# Patient Record
Sex: Male | Born: 1990 | Race: White | Hispanic: No | Marital: Single | Smoking: Current every day smoker
Health system: Southern US, Community
[De-identification: ages and names within clinical notes are randomized; demographics above are authoritative.]

## PROBLEM LIST (undated history)

## (undated) HISTORY — PX: MANDIBLE SURGERY: SHX707

---
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
## 2009-09-24 ENCOUNTER — Inpatient Hospital Stay (HOSPITAL_COMMUNITY): Admission: AC | Admit: 2009-09-24 | Discharge: 2009-09-25 | Payer: Self-pay

## 2010-06-18 LAB — LACTIC ACID, PLASMA: Lactic Acid, Venous: 2.7 mmol/L — ABNORMAL HIGH (ref 0.5–2.2)

## 2010-06-18 LAB — URINE MICROSCOPIC-ADD ON

## 2010-06-18 LAB — CBC
HCT: 39.7 % (ref 39.0–52.0)
Hemoglobin: 13.5 g/dL (ref 13.0–17.0)
MCHC: 34 g/dL (ref 30.0–36.0)
MCHC: 34.5 g/dL (ref 30.0–36.0)
MCV: 93.8 fL (ref 78.0–100.0)
Platelets: 186 10*3/uL (ref 150–400)
Platelets: 212 10*3/uL (ref 150–400)
RBC: 4.22 MIL/uL (ref 4.22–5.81)
RDW: 13.1 % (ref 11.5–15.5)
WBC: 10.4 10*3/uL (ref 4.0–10.5)
WBC: 10.8 10*3/uL — ABNORMAL HIGH (ref 4.0–10.5)

## 2010-06-18 LAB — MRSA PCR SCREENING: MRSA by PCR: NEGATIVE

## 2010-06-18 LAB — PROTIME-INR
INR: 1.23 (ref 0.00–1.49)
Prothrombin Time: 15.4 seconds — ABNORMAL HIGH (ref 11.6–15.2)

## 2010-06-18 LAB — URINALYSIS, ROUTINE W REFLEX MICROSCOPIC
Bilirubin Urine: NEGATIVE
Ketones, ur: NEGATIVE mg/dL
Nitrite: NEGATIVE
Protein, ur: NEGATIVE mg/dL
Specific Gravity, Urine: 1.006 (ref 1.005–1.030)
Urobilinogen, UA: 0.2 mg/dL (ref 0.0–1.0)
pH: 6.5 (ref 5.0–8.0)

## 2010-06-18 LAB — COMPREHENSIVE METABOLIC PANEL
ALT: 16 U/L (ref 0–53)
AST: 21 U/L (ref 0–37)
CO2: 22 mEq/L (ref 19–32)
Calcium: 7.6 mg/dL — ABNORMAL LOW (ref 8.4–10.5)
Potassium: 3.7 mEq/L (ref 3.5–5.1)
Total Bilirubin: 0.5 mg/dL (ref 0.3–1.2)
Total Protein: 6.2 g/dL (ref 6.0–8.3)

## 2010-06-18 LAB — BASIC METABOLIC PANEL
BUN: 6 mg/dL (ref 6–23)
CO2: 27 mEq/L (ref 19–32)
Calcium: 8.9 mg/dL (ref 8.4–10.5)
GFR calc Af Amer: >60 mL/min (ref >60)
GFR calc non Af Amer: >60 mL/min (ref >60)

## 2010-06-18 LAB — APTT: aPTT: 26 seconds (ref 24–37)

## 2010-06-18 LAB — RAPID URINE DRUG SCREEN, HOSP PERFORMED
Amphetamines: NOT DETECTED
Barbiturates: NOT DETECTED
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
## 2011-01-10 LAB — URINALYSIS, ROUTINE W REFLEX MICROSCOPIC
Bilirubin Urine: NEGATIVE
Glucose, UA: NEGATIVE
Hgb urine dipstick: NEGATIVE
Ketones, ur: NEGATIVE
Nitrite: NEGATIVE
Protein, ur: NEGATIVE
Specific Gravity, Urine: 1.014
Urobilinogen, UA: 0.2
pH: 7
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
## 2013-02-24 ENCOUNTER — Emergency Department (HOSPITAL_COMMUNITY)
Admission: EM | Admit: 2013-02-24 | Discharge: 2013-02-24 | Disposition: A | Discharge status: Home / Self Care | Payer: Self-pay | Attending: Emergency Medicine | Admitting: Emergency Medicine

## 2013-02-24 ENCOUNTER — Emergency Department (HOSPITAL_COMMUNITY): Payer: Self-pay

## 2013-02-24 ENCOUNTER — Encounter (HOSPITAL_COMMUNITY): Payer: Self-pay | Admitting: Emergency Medicine

## 2013-02-24 DIAGNOSIS — F17200 Nicotine dependence, unspecified, uncomplicated: Secondary | ICD-10-CM | POA: Insufficient documentation

## 2013-02-24 DIAGNOSIS — S02650A Fracture of angle of mandible, unspecified side, initial encounter for closed fracture: Secondary | ICD-10-CM | POA: Insufficient documentation

## 2013-02-24 DIAGNOSIS — S02609A Fracture of mandible, unspecified, initial encounter for closed fracture: Secondary | ICD-10-CM
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
## 2013-02-24 NOTE — ED Notes (Signed)
States Marijuana use today and took pill for pain. States he also took pill and does not know what it is. States pill was blue.
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
## 2013-02-25 NOTE — ED Provider Notes (Signed)
Medical screening examination/treatment/procedure(s) were performed by non-physician practitioner and as supervising physician I was immediately available for consultation/collaboration.     Geoffery Lyons, MD 02/25/13 0001

## 2017-05-01 ENCOUNTER — Encounter (HOSPITAL_COMMUNITY): Payer: Self-pay

## 2017-05-01 ENCOUNTER — Emergency Department (HOSPITAL_COMMUNITY)
Admission: EM | Admit: 2017-05-01 | Discharge: 2017-05-02 | Disposition: A | Discharge status: Home / Self Care | Payer: Medicaid Other | Attending: Emergency Medicine | Admitting: Emergency Medicine

## 2017-05-01 DIAGNOSIS — K047 Periapical abscess without sinus: Secondary | ICD-10-CM | POA: Insufficient documentation

## 2017-05-01 DIAGNOSIS — Z79899 Other long term (current) drug therapy: Secondary | ICD-10-CM | POA: Diagnosis not present

## 2017-05-01 DIAGNOSIS — K0889 Other specified disorders of teeth and supporting structures: Secondary | ICD-10-CM

## 2017-05-01 DIAGNOSIS — F17210 Nicotine dependence, cigarettes, uncomplicated: Secondary | ICD-10-CM | POA: Insufficient documentation

## 2017-05-01 MED ORDER — PENICILLIN V POTASSIUM 500 MG PO TABS: 500.0000 mg | ORAL_TABLET | Freq: Four times a day (QID) | ORAL | 0 refills | Status: AC

## 2017-05-01 MED ORDER — LIDOCAINE VISCOUS 2 % MT SOLN: 20.0000 mL | OROMUCOSAL | 0 refills | Status: DC | PRN

## 2017-05-01 MED ORDER — PENICILLIN V POTASSIUM 250 MG PO TABS
500.0000 mg | ORAL_TABLET | Freq: Once | ORAL | Status: AC
  Administered 2017-05-02: 500 mg via ORAL
  Filled 2017-05-01: qty 2

## 2017-05-01 NOTE — Discharge Instructions (Signed)
Take antibiotics as prescribed.  Take the entire course of antibiotics, even if your symptoms improve. Use viscous lidocaine as needed for pain. Continue to use Tylenol and ibuprofen as needed for pain and swelling. Follow-up with a dentist.  There is information about dentists in the area and the insurance is the except in the paperwork. Return to the emergency room if you develop high fevers, difficulty breathing, difficulty opening her mouth, or any new or worsening symptoms.

## 2017-05-01 NOTE — ED Triage Notes (Signed)
Pt states that for the past five days he has had dental pain due to broken tooth on upper L side

## 2017-05-01 NOTE — ED Notes (Signed)
ED Provider at bedside. 

## 2017-05-02 NOTE — ED Provider Notes (Signed)
[facility] Provider Note   CSN: [id number] Arrival date & time: 05/01/17  2036     History   Chief Complaint Chief Complaint  Patient presents with  . Dental Pain    HPI Frederick ShanBrandon K Hunt is a 27 y.o. male presenting for evaluation of tooth pain.  Pt states for the past 5 days, he has had pain of left upper and lower teeth.  He reports he has had intermittent pain of both of these areas over the past several months, but the past 5 days have been worse.  He has been taking Tylenol and ibuprofen without improvement of pain.  Ice initially hurts, but then numbs the area and gives mild relief.  He denies fevers, chills, nausea, or vomiting.  He states he has had to have multiple teeth pulled in the past, and has a history of poor dentition.  He has no other medical problems, does not take medications daily.  He is not immunocompromise.  He has no allergies to antibiotics.  HPI  History reviewed. No pertinent past medical history.  There are no active problems to display for this patient.   Past Surgical History:  Procedure Laterality Date  . MANDIBLE SURGERY         Home Medications    Prior to Admission medications   Medication Sig Start Date End Date Taking? Authorizing Provider  lidocaine (XYLOCAINE) 2 % solution Use as directed 20 mLs in the mouth or throat as needed for mouth pain. 05/01/17   Amarri Michaelson, PA-C  OVER THE COUNTER MEDICATION Take 1 tablet by mouth daily as needed (pain). Over the counter [employer] brand pain reliever    [provider]  penicillin v potassium (VEETID) 500 MG tablet Take 1 tablet (500 mg total) by mouth 4 (four) times daily for 7 days. 05/01/17 05/08/17  Rudi Knippenberg, PA-C  Pseudoeph-Doxylamine-DM-APAP (NYQUIL PO) Take 1 tablet by mouth at bedtime as needed (pain).    [provider]    Family History Family History  Problem Relation Age of Onset  . Hypertension Father      Social History Social History   Tobacco Use  . Smoking status: Current Every Day Smoker    Packs/day: 0.50    Types: Cigarettes  . Smokeless tobacco: Never Used  Substance Use Topics  . Alcohol use: Yes  . Drug use: Yes    Types: Marijuana     Allergies   Patient has no known allergies.   Review of Systems Review of Systems  Constitutional: Negative for chills and fever.  HENT: Positive for dental problem. Negative for trouble swallowing.      Physical Exam Updated Vital Signs BP 135/87 (BP Location: Right Arm)   Pulse 75   Temp 97.9 F (36.6 C) (Oral)   Resp 18   SpO2 100%   Physical Exam  Constitutional: He is oriented to person, place, and time. He appears well-developed and well-nourished. No distress.  HENT:  Head: Normocephalic and atraumatic.  Mouth/Throat: No oral lesions. No trismus in the jaw. Abnormal dentition. Dental caries present. No uvula swelling.    Patient with very poor dentition, multiple missing teeth, and multiple dental caries.  Tenderness of left upper and lower teeth with surrounding gum erythema and edema.  Uvula midline with equal palate rise.  No pain under the tongue.  No trismus.  Handling secretions easily.  No pain or swelling extending into the neck.  Eyes: EOM are normal.  Neck: Normal range of motion.  Cardiovascular: Normal rate, regular rhythm and intact distal pulses.  Pulmonary/Chest: Effort normal and breath sounds normal. No respiratory distress. He has no wheezes.  Abdominal: He exhibits no distension.  Musculoskeletal: Normal range of motion.  Neurological: He is alert and oriented to person, place, and time.  Skin: Skin is warm. No rash noted.  Psychiatric: He has a normal mood and affect.  Nursing note and vitals reviewed.    ED Treatments / Results  Labs (all labs ordered are listed, but only abnormal results are displayed) Labs Reviewed - No data to display  EKG  EKG Interpretation None        Radiology No results found.  Procedures Procedures (including critical care time)  Medications Ordered in ED Medications  penicillin v potassium (VEETID) tablet 500 mg (500 mg Oral Given 05/02/17 0000)     Initial Impression / Assessment and Plan / ED Course  I have reviewed the triage vital signs and the nursing notes.  Pertinent labs & imaging results that were available during my care of the patient were reviewed by me and considered in my medical decision making (see chart for details).     Patient presenting for dental pain.  Physical exam reassuring, he is afebrile not tachycardic.  He appears nontoxic.  Doubt systemic infection.  Doubt Ludwig's.  Likely local dental infections due to poor dentition.  Will prescribe antibiotics, NSAIDs, and viscous lidocaine.  Patient given information for dentist for f/u.  At this time, patient appears safe for discharge.  Return precautions given.  Patient states he understands and agrees to plan.   Final Clinical Impressions(s) / ED Diagnoses   Final diagnoses:  Pain, dental  Dental infection    ED Discharge Orders        Ordered    penicillin v potassium (VEETID) 500 MG tablet  4 times daily     05/01/17 2355    lidocaine (XYLOCAINE) 2 % solution  As needed     05/01/17 2355       Alveria Apley, PA-C 05/02/17 0119    Zadie Rhine, MD 05/02/17 0422

## 2017-05-28 ENCOUNTER — Encounter (HOSPITAL_COMMUNITY): Payer: Self-pay | Admitting: Emergency Medicine

## 2017-05-28 ENCOUNTER — Other Ambulatory Visit: Payer: Self-pay

## 2017-05-28 ENCOUNTER — Ambulatory Visit (HOSPITAL_COMMUNITY)
Admission: EM | Admit: 2017-05-28 | Discharge: 2017-05-28 | Disposition: A | Discharge status: Home / Self Care | Payer: Medicaid Other | Attending: Family Medicine | Admitting: Family Medicine

## 2017-05-28 DIAGNOSIS — K0889 Other specified disorders of teeth and supporting structures: Secondary | ICD-10-CM

## 2017-05-28 MED ORDER — AMOXICILLIN 875 MG PO TABS: 875.0000 mg | ORAL_TABLET | Freq: Two times a day (BID) | ORAL | 0 refills | Status: AC

## 2017-05-28 MED ORDER — HYDROCODONE-ACETAMINOPHEN 5-325 MG PO TABS: 1.0000 | ORAL_TABLET | Freq: Four times a day (QID) | ORAL | 0 refills | Status: DC | PRN

## 2017-05-28 NOTE — ED Triage Notes (Signed)
Pt has been having left upper and lower dental pain for two months.  He states he has an appointment with a dentist on March 8.

## 2017-05-29 NOTE — ED Provider Notes (Signed)
  [facility]   [id number] 05/28/17 Arrival Time: 1714  ASSESSMENT & PLAN:  1. Pain, dental     Meds ordered this encounter  Medications  . HYDROcodone-acetaminophen (NORCO/VICODIN) 5-325 MG tablet    Sig: Take 1 tablet by mouth every 6 (six) hours as needed for moderate pain or severe pain.    Dispense:  8 tablet    Refill:  0  . amoxicillin (AMOXIL) 875 MG tablet    Sig: Take 1 tablet (875 mg total) by mouth 2 (two) times daily for 10 days.    Dispense:  20 tablet    Refill:  0   [place] Controlled Substances Registry consulted for this patient. I feel the risk/benefit ratio today is favorable for proceeding with this prescription for a controlled substance. Medication sedation precautions given.  Has dental f/u scheduled for March 8. May f/u here as needed.  Reviewed expectations re: course of current medical issues. Questions answered. Outlined signs and symptoms indicating need for more acute intervention. Patient verbalized understanding. After Visit Summary given.   SUBJECTIVE:  Frederick Hunt is a 27 y.o. male who reports gradual onset of left upper, left lower dental pain described as aching. Present for 2 months, off and on. Afebrile. Tolerating PO intake but reports pain with chewing. Normal swallowing. He does not see a dentist regularly. No neck swelling or pain. OTC analgesics without relief.  ROS: As per HPI.  OBJECTIVE:  Vitals:   05/28/17 1813  BP: (!) 100/41  Pulse: 73  Temp: 98.2 F (36.8 C)  TempSrc: Oral  SpO2: 100%    General appearance: alert; no distress HENT: normocephalic; atraumatic; dentition: poor; gingival hypertrophy over left upper, left lower gums without areas of fluctuance; L upper and lower molars deteriorated and discolored Neck: supple without LAD Lungs: normal respirations Skin: warm and dry Psychological: alert and cooperative; normal mood and affect  No Known Allergies   Social History   Socioeconomic History    . Marital status: Single    Spouse name: Not on file  . Number of children: Not on file  . Years of education: Not on file  . Highest education level: Not on file  Social Needs  . Financial resource strain: Not on file  . Food insecurity - worry: Not on file  . Food insecurity - inability: Not on file  . Transportation needs - medical: Not on file  . Transportation needs - non-medical: Not on file  Occupational History  . Not on file  Tobacco Use  . Smoking status: Current Every Day Smoker    Packs/day: 0.50    Types: Cigarettes  . Smokeless tobacco: Never Used  Substance and Sexual Activity  . Alcohol use: Yes  . Drug use: Yes    Types: Marijuana  . Sexual activity: Yes  Other Topics Concern  . Not on file  Social History Narrative  . Not on file   Family History  Problem Relation Age of Onset  . Hypertension Father    Past Surgical History:  Procedure Laterality Date  . MANDIBLE SURGERY       Mardella LaymanHagler, Gwen Sarvis, MD 05/29/17 [id number]

## 2017-06-08 ENCOUNTER — Other Ambulatory Visit: Payer: Self-pay

## 2017-06-08 ENCOUNTER — Encounter (HOSPITAL_COMMUNITY): Payer: Self-pay | Admitting: *Deleted

## 2017-06-08 ENCOUNTER — Ambulatory Visit (HOSPITAL_COMMUNITY)
Admission: EM | Admit: 2017-06-08 | Discharge: 2017-06-08 | Disposition: A | Discharge status: Home / Self Care | Payer: Medicaid Other | Attending: Family Medicine | Admitting: Family Medicine

## 2017-06-08 DIAGNOSIS — E860 Dehydration: Secondary | ICD-10-CM | POA: Diagnosis not present

## 2017-06-08 NOTE — ED Triage Notes (Signed)
Reports n/v/d 4 days ago, lasting x 3 days.  Symptoms resolved as of yesterday.  Pt tolerating PO fluids and food well.  Only c/o some lingering weakness.  Boss requesting work note.

## 2017-06-08 NOTE — ED Provider Notes (Signed)
  [facility]    CSN: [id number] Arrival date & time: 06/08/17  1207  Chief Complaint  Patient presents with  . Emesis  . Diarrhea   Pt here for a resolving illness. Needs a dr's note to get back to work and for being excused. No longer having N/V/D or pain. Still a little weak, but doing better.  BP 128/84   Pulse 88   Temp 98 F (36.7 C) (Oral)   Resp 16   SpO2 97%  Gen- awake Heart- RRR Lungs- CTAB, no access msc use Abd- BS+, soft, NT, ND Psych- age appropriate judgment and insight  Dehydration  Letter written.  Cont supportive care and push fluids. F/u prn. Pt voiced understanding and agreement to the plan.   Sharlene DoryWendling, Frederick Barth Paul, DO 06/08/17 1329

## 2017-06-08 NOTE — Discharge Instructions (Addendum)
Keep pushing fluids.

## 2017-07-25 ENCOUNTER — Other Ambulatory Visit: Payer: Self-pay

## 2017-07-25 ENCOUNTER — Encounter (HOSPITAL_COMMUNITY): Payer: Self-pay | Admitting: Emergency Medicine

## 2017-07-25 ENCOUNTER — Ambulatory Visit (HOSPITAL_COMMUNITY)
Admission: EM | Admit: 2017-07-25 | Discharge: 2017-07-25 | Disposition: A | Discharge status: Home / Self Care | Payer: Medicaid Other | Attending: Internal Medicine | Admitting: Internal Medicine

## 2017-07-25 DIAGNOSIS — K0889 Other specified disorders of teeth and supporting structures: Secondary | ICD-10-CM

## 2017-07-25 MED ORDER — IBUPROFEN 800 MG PO TABS: 800.0000 mg | ORAL_TABLET | Freq: Three times a day (TID) | ORAL | 0 refills | Status: AC

## 2017-07-25 MED ORDER — HYDROCODONE-ACETAMINOPHEN 5-325 MG PO TABS: 1.0000 | ORAL_TABLET | Freq: Four times a day (QID) | ORAL | 0 refills | Status: AC | PRN

## 2017-07-25 MED ORDER — AMOXICILLIN-POT CLAVULANATE 875-125 MG PO TABS: 1.0000 | ORAL_TABLET | Freq: Two times a day (BID) | ORAL | 0 refills | Status: AC

## 2017-07-25 NOTE — Discharge Instructions (Signed)
Please use dental resource to contact offices to seek permenant treatment/relief.  ° °Today we have given you an antibiotic- Augmentin. This should help with pain as any infection is cleared.  ° °For pain please take 600mg-800mg of Ibuprofen every 8 hours, take with 1000 mg of Tylenol Extra strength every 8 hours. These are safe to take together. Please take with food.  ° °I have also provided 3 days worth of stronger pain medication. This should only be used for severe pain. Do not drive or operate machinery while taking this medication.  ° °Please return if you start to experience significant swelling of your face, experiencing fever. °

## 2017-07-25 NOTE — ED Provider Notes (Signed)
[facility]    CSN: [id number] Arrival date & time: 07/25/17  1049     History   Chief Complaint Chief Complaint  Patient presents with  . Dental Pain    HPI Frederick Hunt is a 27 y.o. male presenting today for evaluation of dental pain.  States that he has had worsening dental pain for the past 3 to 4 days beginning Sunday.  He denies any known swelling, does feel like it was swollen on Sunday.  Patient has plans to follow-up with oral surgery as he has been told he needs to have 10 teeth removed.  Is taking ibuprofen 800 to thousand milligrams with minimal relief.  HPI  History reviewed. No pertinent past medical history.  There are no active problems to display for this patient.   Past Surgical History:  Procedure Laterality Date  . MANDIBLE SURGERY         Home Medications    Prior to Admission medications   Medication Sig Start Date End Date Taking? Authorizing Provider  amoxicillin-clavulanate (AUGMENTIN) 875-125 MG tablet Take 1 tablet by mouth every 12 (twelve) hours for 7 days. 07/25/17 08/01/17  Dareen Gutzwiller C, PA-C  HYDROcodone-acetaminophen (NORCO/VICODIN) 5-325 MG tablet Take 1 tablet by mouth every 6 (six) hours as needed for up to 3 days. 07/25/17 07/28/17  Quinnlan Abruzzo C, PA-C  ibuprofen (ADVIL,MOTRIN) 800 MG tablet Take 1 tablet (800 mg total) by mouth 3 (three) times daily. 07/25/17   Odessie Polzin, Junius Creamer, PA-C    Family History Family History  Problem Relation Age of Onset  . Hypertension Father     Social History Social History   Tobacco Use  . Smoking status: Current Every Day Smoker    Packs/day: 0.50    Types: Cigarettes  . Smokeless tobacco: Never Used  Substance Use Topics  . Alcohol use: Yes  . Drug use: Yes    Types: Marijuana     Allergies   Patient has no known allergies.   Review of Systems Review of Systems  Constitutional: Negative for activity change, appetite change, fatigue and fever.  HENT: Positive  for dental problem. Negative for congestion, ear pain, postnasal drip, rhinorrhea, sinus pressure and sore throat.   Eyes: Negative for pain and itching.  Respiratory: Negative for cough and shortness of breath.   Cardiovascular: Negative for chest pain.  Gastrointestinal: Negative for abdominal pain, diarrhea, nausea and vomiting.  Musculoskeletal: Negative for myalgias.  Skin: Negative for rash.  Neurological: Positive for headaches. Negative for dizziness and light-headedness.     Physical Exam Triage Vital Signs ED Triage Vitals  Enc Vitals Group     BP 07/25/17 1137 (!) 141/77     Pulse Rate 07/25/17 1137 77     Resp --      Temp 07/25/17 1137 98.5 F (36.9 C)     Temp Source 07/25/17 1137 Oral     SpO2 07/25/17 1137 100 %     Weight --      Height --      Head Circumference --      Peak Flow --      Pain Score 07/25/17 1136 5     Pain Loc --      Pain Edu? --      Excl. in GC? --    No data found.  Updated Vital Signs BP (!) 141/77 (BP Location: Left Arm)   Pulse 77   Temp 98.5 F (36.9 C) (Oral)   SpO2  100%   Visual Acuity Right Eye Distance:   Left Eye Distance:   Bilateral Distance:    Right Eye Near:   Left Eye Near:    Bilateral Near:     Physical Exam  Constitutional: He appears well-developed and well-nourished.  HENT:  Head: Normocephalic and atraumatic.  Overall poor dentition, problematic tooth right upper jaw with evidence of decay and fracture.  Gingival tenderness to palpation around the tooth.  No obvious swelling or facial asymmetry.  Eyes: Conjunctivae are normal.  Neck: Neck supple.  Cardiovascular: Normal rate.  Pulmonary/Chest: Effort normal. No respiratory distress.  Musculoskeletal: He exhibits no edema.  Neurological: He is alert.  Skin: Skin is warm and dry.  Psychiatric: He has a normal mood and affect.  Nursing note and vitals reviewed.    UC Treatments / Results  Labs (all labs ordered are listed, but only abnormal  results are displayed) Labs Reviewed - No data to display  EKG None Radiology No results found.  Procedures Procedures (including critical care time)  Medications Ordered in UC Medications - No data to display   Initial Impression / Assessment and Plan / UC Course  I have reviewed the triage vital signs and the nursing notes.  Pertinent labs & imaging results that were available during my care of the patient were reviewed by me and considered in my medical decision making (see chart for details).     We will begin patient on Augmentin as well as provide a few days worth of stronger pain medicine to use at night.  Continue ibuprofen and Tylenol during the day.  Follow-up if symptoms worsening or developing any facial swelling. Discussed strict return precautions. Patient verbalized understanding and is agreeable with plan.   Final Clinical Impressions(s) / UC Diagnoses   Final diagnoses:  Pain, dental    ED Discharge Orders        Ordered    ibuprofen (ADVIL,MOTRIN) 800 MG tablet  3 times daily     07/25/17 1217    amoxicillin-clavulanate (AUGMENTIN) 875-125 MG tablet  Every 12 hours     07/25/17 1217    HYDROcodone-acetaminophen (NORCO/VICODIN) 5-325 MG tablet  Every 6 hours PRN     07/25/17 1217       Controlled Substance Prescriptions [place] Controlled Substance Registry consulted? Yes   Lew DawesWieters, Giles Currie C, [place]PA-C 07/25/17 1228

## 2017-07-25 NOTE — ED Triage Notes (Signed)
C/o right side tooth pain onset Sunday

## 2018-01-30 ENCOUNTER — Emergency Department (HOSPITAL_COMMUNITY)
Admission: EM | Admit: 2018-01-30 | Discharge: 2018-01-30 | Disposition: A | Discharge status: Home / Self Care | Payer: Medicaid Other | Attending: Emergency Medicine | Admitting: Emergency Medicine

## 2018-01-30 ENCOUNTER — Other Ambulatory Visit: Payer: Self-pay

## 2018-01-30 ENCOUNTER — Encounter (HOSPITAL_COMMUNITY): Payer: Self-pay | Admitting: Emergency Medicine

## 2018-01-30 DIAGNOSIS — Z79899 Other long term (current) drug therapy: Secondary | ICD-10-CM | POA: Diagnosis not present

## 2018-01-30 DIAGNOSIS — K0889 Other specified disorders of teeth and supporting structures: Secondary | ICD-10-CM | POA: Insufficient documentation

## 2018-01-30 DIAGNOSIS — K029 Dental caries, unspecified: Secondary | ICD-10-CM

## 2018-01-30 DIAGNOSIS — F17210 Nicotine dependence, cigarettes, uncomplicated: Secondary | ICD-10-CM | POA: Diagnosis not present

## 2018-01-30 MED ORDER — HYDROCODONE-ACETAMINOPHEN 5-325 MG PO TABS: 1.0000 | ORAL_TABLET | ORAL | 0 refills | Status: DC | PRN

## 2018-01-30 MED ORDER — AMOXICILLIN 500 MG PO CAPS: 1000.0000 mg | ORAL_CAPSULE | Freq: Two times a day (BID) | ORAL | 0 refills | Status: DC

## 2018-01-30 MED ORDER — AMOXICILLIN 500 MG PO CAPS
1000.0000 mg | ORAL_CAPSULE | Freq: Once | ORAL | Status: AC
  Administered 2018-01-30: 1000 mg via ORAL
  Filled 2018-01-30: qty 2

## 2018-01-30 NOTE — ED Triage Notes (Signed)
Pt from home. Awoke with left sided lower dental pain and swelling. Has had this before and had to take anbx.  No fever or chills, n/v.

## 2018-01-30 NOTE — ED Notes (Signed)
Patient verbalizes understanding of discharge instructions. Opportunity for questioning and answers were provided. Armband removed by staff, pt discharged from ED ambulatory.   

## 2018-01-30 NOTE — ED Provider Notes (Signed)
[facility] EMERGENCY DEPARTMENT Provider Note   CSN: [id number] Arrival date & time: 01/30/18  0227     History   Chief Complaint Chief Complaint  Patient presents with  . Dental Pain    HPI Frederick Hunt is a 27 y.o. male.  The history is provided by the patient.  He complains dental pain on the left side lower.  Pain started yesterday.  He has had problems intermittently with that side and has been seen in the emergency in the past.  He saw a dentist who told him that he needed to see an oral surgeon because he had 10 teeth that needed to be extracted.  He currently rates his pain at 8/10.  Pain is better if he is able to keep ice on the teeth.  Nothing makes it worse except when the ice is removed.  He has taken ibuprofen at home without any relief.  History reviewed. No pertinent past medical history.  There are no active problems to display for this patient.   Past Surgical History:  Procedure Laterality Date  . MANDIBLE SURGERY          Home Medications    Prior to Admission medications   Medication Sig Start Date End Date Taking? Authorizing Provider  ibuprofen (ADVIL,MOTRIN) 800 MG tablet Take 1 tablet (800 mg total) by mouth 3 (three) times daily. 07/25/17   Wieters, Junius Creamer, PA-C    Family History Family History  Problem Relation Age of Onset  . Hypertension Father     Social History Social History   Tobacco Use  . Smoking status: Current Every Day Smoker    Packs/day: 0.50    Types: Cigarettes  . Smokeless tobacco: Never Used  Substance Use Topics  . Alcohol use: Yes  . Drug use: Yes    Types: Marijuana     Allergies   Patient has no known allergies.   Review of Systems Review of Systems  All other systems reviewed and are negative.    Physical Exam Updated Vital Signs BP (!) 144/94 (BP Location: Right Arm)   Pulse 72   SpO2 100%   Physical Exam  Nursing note and vitals reviewed.  27 year old male,  resting comfortably and in no acute distress. Vital signs are significant for elevated blood pressure. Oxygen saturation is 100%, which is normal. Head is normocephalic and atraumatic. PERRLA, EOMI. Oropharynx is clear.  Very poor dentition with multiple caries.  Tooth #19 has severe caries and is tender to percussion and this is the tooth that is giving him problems.  There is no gingival swelling or pallor or erythema. Neck is nontender and supple without adenopathy or JVD. Back is nontender and there is no CVA tenderness. Lungs are clear without rales, wheezes, or rhonchi. Chest is nontender. Heart has regular rate and rhythm without murmur. Abdomen is soft, flat, nontender without masses or hepatosplenomegaly and peristalsis is normoactive. Extremities have no cyanosis or edema, full range of motion is present. Skin is warm and dry without rash. Neurologic: Mental status is normal, cranial nerves are intact, there are no motor or sensory deficits.  ED Treatments / Results   Procedures Procedures   Medications Ordered in ED Medications  amoxicillin (AMOXIL) capsule 1,000 mg (has no administration in time range)     Initial Impression / Assessment and Plan / ED Course  I have reviewed the triage vital signs and the nursing notes.  Pain from dental caries.  Old  records are reviewed, and he has several prior ED visits for dental pain in the same location.  I have also reviewed his record on the [place] controlled substance reporting website, and he has not had any narcotic prescriptions in the last 6months.  He is given dental resources and discharged with prescription for amoxicillin and a small number of hydrocodone-acetaminophen tablets.  Advised that he would need to see a dentist to have his teeth managed appropriately.  Final Clinical Impressions(s) / ED Diagnoses   Final diagnoses:  Pain due to dental caries    ED Discharge Orders         Ordered    amoxicillin  (AMOXIL) 500 MG capsule  2 times daily     01/30/18 0256    HYDROcodone-acetaminophen (NORCO) 5-325 MG tablet  Every 4 hours PRN     01/30/18 0256           Dione Booze, MD 01/30/18 0302

## 2018-09-22 ENCOUNTER — Ambulatory Visit (HOSPITAL_COMMUNITY)
Admission: EM | Admit: 2018-09-22 | Discharge: 2018-09-22 | Disposition: A | Discharge status: Home / Self Care | Payer: Medicaid Other | Attending: Family Medicine | Admitting: Family Medicine

## 2018-09-22 ENCOUNTER — Encounter (HOSPITAL_COMMUNITY): Payer: Self-pay

## 2018-09-22 ENCOUNTER — Other Ambulatory Visit: Payer: Self-pay

## 2018-09-22 DIAGNOSIS — R1013 Epigastric pain: Secondary | ICD-10-CM | POA: Diagnosis not present

## 2018-09-22 NOTE — Discharge Instructions (Signed)

## 2018-09-22 NOTE — ED Triage Notes (Signed)
Pt presents with complaints of middle abdominal pain that started this am when he woke up. Denies any other symptoms.

## 2018-09-24 NOTE — ED Provider Notes (Signed)
[facility]   [id number] 09/22/18 Arrival Time: 5462  ASSESSMENT & PLAN:  1. Epigastric pain    Symptoms beginning approx 6 hours ago and improving. Questions GERD. Benign/non-surgical abdominal exam. No indications for urgent abdominal/pelvic imaging at this time. Discussed. Close observations. Trial of OTC Pepcid if he desires. Discussed.   Discharge Instructions     You have been seen today for abdominal pain. Your evaluation was not suggestive of any emergent condition requiring medical intervention at this time. However, some abdominal problems make take more time to appear. Therefore, it is very important for you to pay attention to any new symptoms or worsening of your current condition.  Please return here or to the Emergency Department immediately should you begin to feel worse in any way or have any of the following symptoms: increasing or different abdominal pain, persistent vomiting, inability to drink fluids, fevers, or shaking chills.    Will f/u here or ED if worsening or developing new symptoms.  Reviewed expectations re: course of current medical issues. Questions answered. Outlined signs and symptoms indicating need for more acute intervention. Patient verbalized understanding. After Visit Summary given.   SUBJECTIVE: History from: patient. Frederick Hunt is a 28 y.o. male who presents with complaint of intermittent epigastric abdominal discomfort. Onset abrupt, first noticed upon waking this morning. Discomfort described as burning and dull; without radiation. Symptoms are gradually improving since beginning. Fever: absent. Aggravating factors: have not been identified. Alleviating factors: have not been identified. Associated symptoms: none identified/reported. He denies arthralgias, chills, constipation, diarrhea, headache, myalgias, nausea, sweats and vomiting. Appetite: normal. PO intake: normal. Ambulatory without assistance. Urinary symptoms: none.  Bowel movements: have not significantly changed; last bowel movement within the past 1-2 d; without blood. History of similar: no. OTC treatment: none reported.  Past Surgical History:  Procedure Laterality Date  . MANDIBLE SURGERY      ROS: As per HPI. All other systems negative.  OBJECTIVE:  Vitals:   09/22/18 1006  BP: 133/69  Pulse: 97  Resp: 18  Temp: 98.5 F (36.9 C)  SpO2: 100%    General appearance: alert, oriented, no acute distress Lungs: clear to auscultation bilaterally; unlabored respirations Heart: regular rate and rhythm Abdomen: soft; without distention; no tenderness; normal bowel sounds; without masses or organomegaly; without guarding or rebound tenderness Back: without CVA tenderness; FROM at waist Extremities: without LE edema; symmetrical; without gross deformities Skin: warm and dry Neurologic: normal gait Psychological: alert and cooperative; normal mood and affect  No Known Allergies                                              Social History   Socioeconomic History  . Marital status: Single    Spouse name: Not on file  . Number of children: Not on file  . Years of education: Not on file  . Highest education level: Not on file  Occupational History  . Not on file  Social Needs  . Financial resource strain: Not on file  . Food insecurity    Worry: Not on file    Inability: Not on file  . Transportation needs    Medical: Not on file    Non-medical: Not on file  Tobacco Use  . Smoking status: Current Every Day Smoker    Packs/day: 0.50    Types: Cigarettes  .  Smokeless tobacco: Never Used  Substance and Sexual Activity  . Alcohol use: Yes  . Drug use: Yes    Types: Marijuana  . Sexual activity: Yes  Lifestyle  . Physical activity    Days per week: Not on file    Minutes per session: Not on file  . Stress: Not on file  Relationships  . Social Musicianconnections    Talks on phone: Not on file    Gets together: Not on file    Attends  religious service: Not on file    Active member of club or organization: Not on file    Attends meetings of clubs or organizations: Not on file    Relationship status: Not on file  . Intimate partner violence    Fear of current or ex partner: Not on file    Emotionally abused: Not on file    Physically abused: Not on file    Forced sexual activity: Not on file  Other Topics Concern  . Not on file  Social History Narrative  . Not on file   Family History  Problem Relation Age of Onset  . Hypertension Father   . Healthy Mother      Mardella LaymanHagler, Serenidy Waltz, MD 09/24/18 [phone]

## 2018-12-01 ENCOUNTER — Emergency Department (HOSPITAL_COMMUNITY): Payer: Medicaid Other

## 2018-12-01 ENCOUNTER — Emergency Department (HOSPITAL_COMMUNITY)
Admission: EM | Admit: 2018-12-01 | Discharge: 2018-12-01 | Disposition: A | Discharge status: Home / Self Care | Payer: Medicaid Other | Attending: Emergency Medicine | Admitting: Emergency Medicine

## 2018-12-01 ENCOUNTER — Other Ambulatory Visit: Payer: Self-pay

## 2018-12-01 ENCOUNTER — Encounter (HOSPITAL_COMMUNITY): Payer: Self-pay | Admitting: Emergency Medicine

## 2018-12-01 DIAGNOSIS — Z79899 Other long term (current) drug therapy: Secondary | ICD-10-CM | POA: Insufficient documentation

## 2018-12-01 DIAGNOSIS — R7989 Other specified abnormal findings of blood chemistry: Secondary | ICD-10-CM | POA: Diagnosis not present

## 2018-12-01 DIAGNOSIS — R319 Hematuria, unspecified: Secondary | ICD-10-CM | POA: Insufficient documentation

## 2018-12-01 DIAGNOSIS — R109 Unspecified abdominal pain: Secondary | ICD-10-CM

## 2018-12-01 DIAGNOSIS — F17210 Nicotine dependence, cigarettes, uncomplicated: Secondary | ICD-10-CM | POA: Insufficient documentation

## 2018-12-01 LAB — COMPREHENSIVE METABOLIC PANEL
ALT: 18 U/L (ref 0–44)
AST: 18 U/L (ref 15–41)
Albumin: 3.9 g/dL (ref 3.5–5.0)
Alkaline Phosphatase: 58 U/L (ref 38–126)
Anion gap: 9 (ref 5–15)
BUN: 13 mg/dL (ref 6–20)
CO2: 25 mmol/L (ref 22–32)
Calcium: 9 mg/dL (ref 8.9–10.3)
Chloride: 104 mmol/L (ref 98–111)
Creatinine, Ser: 1.42 mg/dL — ABNORMAL HIGH (ref 0.61–1.24)
GFR calc Af Amer: >60 mL/min (ref >60)
GFR calc non Af Amer: >60 mL/min (ref >60)
Glucose, Bld: 101 mg/dL — ABNORMAL HIGH (ref 70–99)
Potassium: 3.9 mmol/L (ref 3.5–5.1)
Sodium: 138 mmol/L (ref 135–145)
Total Bilirubin: 0.6 mg/dL (ref 0.3–1.2)
Total Protein: 6.7 g/dL (ref 6.5–8.1)

## 2018-12-01 LAB — CBC
HCT: 42.6 % (ref 39.0–52.0)
Hemoglobin: 14.1 g/dL (ref 13.0–17.0)
MCH: 30.9 pg (ref 26.0–34.0)
MCHC: 33.1 g/dL (ref 30.0–36.0)
MCV: 93.4 fL (ref 80.0–100.0)
Platelets: 238 10*3/uL (ref 150–400)
RBC: 4.56 MIL/uL (ref 4.22–5.81)
RDW: 12.2 % (ref 11.5–15.5)
WBC: 12 10*3/uL — ABNORMAL HIGH (ref 4.0–10.5)
nRBC: 0 % (ref 0.0–0.2)

## 2018-12-01 LAB — URINALYSIS, ROUTINE W REFLEX MICROSCOPIC
Bacteria, UA: NONE SEEN
Bilirubin Urine: NEGATIVE
Glucose, UA: NEGATIVE mg/dL
Ketones, ur: NEGATIVE mg/dL
Leukocytes,Ua: NEGATIVE
Nitrite: NEGATIVE
Protein, ur: NEGATIVE mg/dL
Specific Gravity, Urine: 1.011 (ref 1.005–1.030)
pH: 7 (ref 5.0–8.0)

## 2018-12-01 MED ORDER — SODIUM CHLORIDE 0.9 % IV BOLUS
1000.0000 mL | Freq: Once | INTRAVENOUS | Status: AC
Start: 2018-12-01 — End: 2018-12-01
  Administered 2018-12-01: 13:00:00 1000 mL via INTRAVENOUS

## 2018-12-01 NOTE — Discharge Instructions (Signed)
Your work-up today suggested that you possibly recently passed a kidney stone.  We cannot see any kidney stones on your imaging but you did have some blood in your urine.  Your kidney function was a little bit abnormally elevated which is probably due to not drinking enough water.  I usually recommend at least around 64 ounces of water daily.  I have attached some information about dietary modifications to help prevent kidney stones.  I would recommend following up with your primary care provider or urologist for reevaluation of your symptoms and to recheck your kidney function to make sure that it improves.  Return to the emergency department if any concerning signs or symptoms develop such as worsening pain, testicular pain or scrotal swelling, persistent vomiting, high fevers,etc.

## 2018-12-01 NOTE — ED Notes (Signed)
Patient transported to CT 

## 2018-12-01 NOTE — ED Provider Notes (Signed)
[facility] EMERGENCY DEPARTMENT Provider Note   CSN: [id number] Arrival date & time: 12/01/18  1000     History   Chief Complaint Chief Complaint  Patient presents with   Nephrolithiasis    HPI ADEM GAMM is a 28 y.o. male presents for evaluation of acute onset, resolved left flank pain that occurred earlier today while at work.  He reports that he was sitting in the middle of a presentation when he developed severe sharp pain to his left lower back radiating to his left groin.  He denies scrotal swelling.  Pain lasted for around 1.5 hours before resolving spontaneously.  He notes associated nausea and attempted to go to the bathroom to urinate but was unable to.  He reports that yesterday he went to a [place] where he ate several items from the buffet but his wife who also ate with him has no symptoms.  At the time yesterday he reports feeling a sense of urinary urgency but was not able to urinate.  Pain worsened with certain movements but he reports he was unable to get comfortable.  Last bowel movement was yesterday and was normal for him.  Denies fever, chills, chest pain, shortness of breath, cough, diarrhea, constipation, melena, hematochezia, dysuria, or hematuria.  His mother has a history of kidney stones.     The history is provided by the patient.    History reviewed. No pertinent past medical history.  There are no active problems to display for this patient.   Past Surgical History:  Procedure Laterality Date   MANDIBLE SURGERY          Home Medications    Prior to Admission medications   Medication Sig Start Date End Date Taking? Authorizing Provider  amoxicillin (AMOXIL) 500 MG capsule Take 2 capsules (1,000 mg total) by mouth 2 (two) times daily. 01/30/18   Dione Booze, MD  HYDROcodone-acetaminophen (NORCO) 5-325 MG tablet Take 1 tablet by mouth every 4 (four) hours as needed for moderate pain. 01/30/18   Dione Booze,  MD  ibuprofen (ADVIL,MOTRIN) 800 MG tablet Take 1 tablet (800 mg total) by mouth 3 (three) times daily. 07/25/17   Wieters, Junius Creamer, PA-C    Family History Family History  Problem Relation Age of Onset   Hypertension Father    Healthy Mother     Social History Social History   Tobacco Use   Smoking status: Current Every Day Smoker    Packs/day: 0.50    Types: Cigarettes   Smokeless tobacco: Never Used  Substance Use Topics   Alcohol use: Yes   Drug use: Yes    Types: Marijuana     Allergies   Patient has no known allergies.   Review of Systems Review of Systems  Constitutional: Negative for chills and fever.  Respiratory: Negative for shortness of breath.   Cardiovascular: Negative for chest pain.  Gastrointestinal: Positive for abdominal pain and nausea. Negative for vomiting.  Genitourinary: Positive for flank pain and urgency. Negative for hematuria.  All other systems reviewed and are negative.    Physical Exam Updated Vital Signs BP 118/70 (BP Location: Left Arm)    Pulse 61    Temp 98.3 F (36.8 C) (Oral)    Resp 18    Ht 5\' 10"  (1.778 m)    Wt 63.5 kg    SpO2 98%    BMI 20.09 kg/m   Physical Exam Vitals signs and nursing note reviewed.  Constitutional:  General: He is not in acute distress.    Appearance: He is well-developed.     Comments: Resting comfortably in bed  HENT:     Head: Normocephalic and atraumatic.  Eyes:     General:        Right eye: No discharge.        Left eye: No discharge.     Conjunctiva/sclera: Conjunctivae normal.  Neck:     Vascular: No JVD.     Trachea: No tracheal deviation.  Cardiovascular:     Rate and Rhythm: Normal rate and regular rhythm.  Pulmonary:     Effort: Pulmonary effort is normal.     Breath sounds: Normal breath sounds.  Abdominal:     General: Abdomen is flat. Bowel sounds are normal. There is no distension.     Palpations: Abdomen is soft.     Tenderness: There is no abdominal  tenderness. There is no right CVA tenderness, left CVA tenderness, guarding or rebound.  Musculoskeletal:     Comments: No midline spine TTP, no paraspinal muscle tenderness, no deformity, crepitus, or step-off noted   Skin:    General: Skin is warm and dry.     Findings: No erythema.  Neurological:     General: No focal deficit present.     Mental Status: He is alert.  Psychiatric:        Behavior: Behavior normal.      ED Treatments / Results  Labs (all labs ordered are listed, but only abnormal results are displayed) Labs Reviewed  COMPREHENSIVE METABOLIC PANEL - Abnormal; Notable for the following components:      Result Value   Glucose, Bld 101 (*)    Creatinine, Ser 1.42 (*)    All other components within normal limits  CBC - Abnormal; Notable for the following components:   WBC 12.0 (*)    All other components within normal limits  URINALYSIS, ROUTINE W REFLEX MICROSCOPIC - Abnormal; Notable for the following components:   Hgb urine dipstick MODERATE (*)    All other components within normal limits    EKG None  Radiology Ct Renal Stone Study  Result Date: 12/01/2018 CLINICAL DATA:  Flank pain EXAM: CT ABDOMEN AND PELVIS WITHOUT CONTRAST TECHNIQUE: Multidetector CT imaging of the abdomen and pelvis was performed following the standard protocol without IV contrast. COMPARISON:  None. FINDINGS: Lower chest: The visualized heart size within normal limits. No pericardial fluid/thickening. No hiatal hernia. The visualized portions of the lungs are clear. Hepatobiliary: Although limited due to the lack of intravenous contrast, normal in appearance without gross focal abnormality. No evidence of calcified gallstones or biliary ductal dilatation. Pancreas:  Unremarkable.  No surrounding inflammatory changes. Spleen: Normal in size. Although limited due to the lack of intravenous contrast, normal in appearance. Adrenals/Urinary Tract: Both adrenal glands appear normal. The kidneys  and collecting system appear normal without evidence of urinary tract calculus or hydronephrosis. Bladder is unremarkable. Stomach/Bowel: The stomach, small bowel, and colon are normal in appearance. No inflammatory changes or obstructive findings. appendix is normal. Vascular/Lymphatic: There are no enlarged abdominal or pelvic lymph nodes. There is a small amount of right iliac vascular calcifications seen. Reproductive: The prostate is unremarkable. Small bilateral hydroceles are present. Other: No evidence of abdominal wall mass or hernia. Musculoskeletal: No acute or significant osseous findings. IMPRESSION: No renal or collecting system calculi.  No hydronephrosis. Small bilateral hydroceles. Electronically Signed   By: Prudencio Pair M.D.   On: 12/01/2018 14:23  Procedures Procedures (including critical care time)  Medications Ordered in ED Medications  sodium chloride 0.9 % bolus 1,000 mL (0 mLs Intravenous Stopped 12/01/18 1513)     Initial Impression / Assessment and Plan / ED Course  I have reviewed the triage vital signs and the nursing notes.  Pertinent labs & imaging results that were available during my care of the patient were reviewed by me and considered in my medical decision making (see chart for details).        Patient presenting for evaluation of resolved left flank pain that started earlier today.  He is afebrile, vital signs are stable.  He is nontoxic in appearance.  He is completely asymptomatic on my assessment with no pain.  History suggestive of kidney stone, will obtain blood work and CT renal stone study and UA and reassess.  Lab work reviewed by me shows mild leukocytosis, no anemia, no metabolic derangements.  His creatinine is mildly elevated but BUN is within normal limits.  I have no recent blood work to compare to but he does tell me that he does not think he drinks enough water so this could be secondary to dehydration.  UA with hematuria, no evidence of  UTI.  Overall consistent with nephrolithiasis.  However, CT renal stone study shows no evidence of renal calculus, hydronephrosis, or pyelonephritis.  However, could have recently passed a kidney stone as he has been entirely asymptomatic while he has been in the ED.  Consider testicular torsion however he does not have any ongoing testicular pain and no complaints of scrotal swelling.  No evidence of acute surgical abdominal pathology otherwise.  On reevaluation patient resting comfortably no apparent distress, tolerating p.o. fluids without difficulty.  He feels comfortable with discharge home.  Recommend follow-up with PCP or urologist for reevaluation of his hematuria and elevated creatinine.  Discussed strict ED return precautions. Pt verbalized understanding of and agreement with plan and is safe for discharge home at this time.   Final Clinical Impressions(s) / ED Diagnoses   Final diagnoses:  Acute right flank pain  Hematuria, unspecified type  Elevated serum creatinine    ED Discharge Orders    None       Jeanie SewerFawze, Daneen Volcy A, PA-C 12/01/18 1554    Melene PlanFloyd, Dan, DO 12/01/18 1812

## 2018-12-01 NOTE — ED Triage Notes (Signed)
Pt reports having left flank pain that radiates to his left side. Pt also reports the urge to urinate and unable to do same.

## 2019-05-05 ENCOUNTER — Ambulatory Visit: Payer: Medicaid Other | Attending: Internal Medicine

## 2019-05-05 DIAGNOSIS — Z20822 Contact with and (suspected) exposure to covid-19: Secondary | ICD-10-CM

## 2019-05-06 LAB — NOVEL CORONAVIRUS, NAA: SARS-CoV-2, NAA: NOT DETECTED

## 2019-05-11 ENCOUNTER — Telehealth: Payer: Self-pay | Admitting: General Practice

## 2019-05-11 NOTE — Telephone Encounter (Signed)
Negative COVID results given. Patient results "NOT Detected." Caller expressed understanding. ° °

## 2019-06-30 ENCOUNTER — Encounter (HOSPITAL_COMMUNITY): Payer: Self-pay | Admitting: Emergency Medicine

## 2019-06-30 ENCOUNTER — Ambulatory Visit (HOSPITAL_COMMUNITY)
Admission: EM | Admit: 2019-06-30 | Discharge: 2019-06-30 | Disposition: A | Discharge status: Home / Self Care | Payer: Medicaid Other

## 2019-06-30 ENCOUNTER — Other Ambulatory Visit: Payer: Self-pay

## 2019-06-30 DIAGNOSIS — Z7689 Persons encountering health services in other specified circumstances: Secondary | ICD-10-CM

## 2019-06-30 NOTE — Discharge Instructions (Addendum)
Safe to return to work  Follow up as needed for continued or worsening symptoms

## 2019-06-30 NOTE — ED Triage Notes (Signed)
Pt here for note to return to work after having a kidney stone that he thinks he passed this am

## 2019-07-02 NOTE — ED Provider Notes (Signed)
[place]    CSN: [id number] Arrival date & time: 06/30/19  1302      History   Chief Complaint Chief Complaint  Patient presents with  . Letter for School/Work    HPI Frederick Hunt is a 29 y.o. male.   Patient is a 29 year old male who presents today for note to return to work.  Reporting hx of kidney stones and believes that he has been suffering with one the past few days. No pain now he believes that it has passed. He needs a note for missing work. No flank pain, hematuria, dysuria, N,V.  ROS per HPI      History reviewed. No pertinent past medical history.  There are no problems to display for this patient.   Past Surgical History:  Procedure Laterality Date  . MANDIBLE SURGERY         Home Medications    Prior to Admission medications   Medication Sig Start Date End Date Taking? Authorizing Provider  ibuprofen (ADVIL,MOTRIN) 800 MG tablet Take 1 tablet (800 mg total) by mouth 3 (three) times daily. 07/25/17   Wieters, Elesa Hacker, PA-C    Family History Family History  Problem Relation Age of Onset  . Hypertension Father   . Healthy Mother     Social History Social History   Tobacco Use  . Smoking status: Current Every Day Smoker    Packs/day: 0.50    Types: Cigarettes  . Smokeless tobacco: Never Used  Substance Use Topics  . Alcohol use: Yes  . Drug use: Yes    Types: Marijuana     Allergies   Patient has no known allergies.   Review of Systems Review of Systems   Physical Exam Triage Vital Signs ED Triage Vitals  Enc Vitals Group     BP 06/30/19 1401 117/81     Pulse Rate 06/30/19 1401 68     Resp 06/30/19 1401 16     Temp 06/30/19 1401 98.1 F (36.7 C)     Temp Source 06/30/19 1401 Oral     SpO2 06/30/19 1401 99 %     Weight --      Height --      Head Circumference --      Peak Flow --      Pain Score 06/30/19 1408 0     Pain Loc --      Pain Edu? --      Excl. in [place]? --    No data found.  Updated  Vital Signs BP 117/81 (BP Location: Left Arm)   Pulse 68   Temp 98.1 F (36.7 C) (Oral)   Resp 16   SpO2 99%   Visual Acuity Right Eye Distance:   Left Eye Distance:   Bilateral Distance:    Right Eye Near:   Left Eye Near:    Bilateral Near:     Physical Exam Vitals and nursing note reviewed.  Constitutional:      Appearance: Normal appearance.  HENT:     Head: Normocephalic and atraumatic.     Nose: Nose normal.  Eyes:     Conjunctiva/sclera: Conjunctivae normal.  Pulmonary:     Effort: Pulmonary effort is normal.  Musculoskeletal:        General: Normal range of motion.     Cervical back: Normal range of motion.  Skin:    General: Skin is warm and dry.  Neurological:     Mental Status: He is alert.  Psychiatric:  Mood and Affect: Mood normal.      UC Treatments / Results  Labs (all labs ordered are listed, but only abnormal results are displayed) Labs Reviewed - No data to display  EKG   Radiology No results found.  Procedures Procedures (including critical care time)  Medications Ordered in UC Medications - No data to display  Initial Impression / Assessment and Plan / UC Course  I have reviewed the triage vital signs and the nursing notes.  Pertinent labs & imaging results that were available during my care of the patient were reviewed by me and considered in my medical decision making (see chart for details).     Return to work evaluation- pt safe to return to work.  Work note given.  Follow up as needed for continued or worsening symptoms  Final Clinical Impressions(s) / UC Diagnoses   Final diagnoses:  Return to work evaluation     Discharge Instructions     Safe to return to work  Follow up as needed for continued or worsening symptoms     ED Prescriptions    None     PDMP not reviewed this encounter.   Dahlia Byes A, NP 07/02/19 [phone]

## 2021-01-17 ENCOUNTER — Ambulatory Visit (HOSPITAL_COMMUNITY)
Admission: EM | Admit: 2021-01-17 | Discharge: 2021-01-17 | Disposition: A | Discharge status: Home / Self Care | Payer: Medicaid Other | Attending: Internal Medicine | Admitting: Internal Medicine

## 2021-01-17 ENCOUNTER — Other Ambulatory Visit: Payer: Self-pay

## 2021-01-17 ENCOUNTER — Encounter (HOSPITAL_COMMUNITY): Payer: Self-pay | Admitting: Emergency Medicine

## 2021-01-17 DIAGNOSIS — A084 Viral intestinal infection, unspecified: Secondary | ICD-10-CM | POA: Diagnosis not present

## 2021-01-17 NOTE — Discharge Instructions (Signed)
Maintain adequate hydration If symptoms worsen ,please return to urgent care to be re-evaluated

## 2021-01-17 NOTE — ED Triage Notes (Signed)
Pt is present today with abdominal pain and vomiting that started Sunday night

## 2021-01-19 NOTE — ED Provider Notes (Signed)
[facility]    CSN: [id number] Arrival date & time: 01/17/21  1451      History   Chief Complaint Chief Complaint  Patient presents with   Abdominal Pain   Emesis    HPI Frederick Hunt is a 30 y.o. male comes to the urgent care requesting a work note to return to work.  Patient had an episode of abdominal pain, vomiting and diarrhea 2 days ago.  No vomiting, abdominal pain and diarrhea has subsided.  Patient's son has similar symptoms a week or so ago.Marland Kitchen   HPI  History reviewed. No pertinent past medical history.  There are no problems to display for this patient.   Past Surgical History:  Procedure Laterality Date   MANDIBLE SURGERY         Home Medications    Prior to Admission medications   Medication Sig Start Date End Date Taking? Authorizing Provider  ibuprofen (ADVIL,MOTRIN) 800 MG tablet Take 1 tablet (800 mg total) by mouth 3 (three) times daily. 07/25/17   Wieters, Junius Creamer, PA-C    Family History Family History  Problem Relation Age of Onset   Hypertension Father    Healthy Mother     Social History Social History   Tobacco Use   Smoking status: Every Day    Packs/day: 0.50    Types: Cigarettes   Smokeless tobacco: Never  Vaping Use   Vaping Use: Never used  Substance Use Topics   Alcohol use: Yes   Drug use: Yes    Types: Marijuana     Allergies   Patient has no known allergies.   Review of Systems Review of Systems  All other systems reviewed and are negative.   Physical Exam Triage Vital Signs ED Triage Vitals  Enc Vitals Group     BP 01/17/21 1630 114/71     Pulse Rate 01/17/21 1630 (!) 108     Resp 01/17/21 1630 18     Temp 01/17/21 1630 97.9 F (36.6 C)     Temp src --      SpO2 01/17/21 1630 97 %     Weight --      Height --      Head Circumference --      Peak Flow --      Pain Score 01/17/21 1628 0     Pain Loc --      Pain Edu? --      Excl. in [place]? --    No data found.  Updated Vital  Signs BP 114/71   Pulse (!) 108   Temp 97.9 F (36.6 C)   Resp 18   SpO2 97%   Visual Acuity Right Eye Distance:   Left Eye Distance:   Bilateral Distance:    Right Eye Near:   Left Eye Near:    Bilateral Near:     Physical Exam Vitals and nursing note reviewed.  Constitutional:      General: He is not in acute distress.    Appearance: He is not ill-appearing.  Cardiovascular:     Rate and Rhythm: Normal rate and regular rhythm.  Pulmonary:     Effort: Pulmonary effort is normal.     Breath sounds: Normal breath sounds.  Abdominal:     General: Abdomen is flat.     Palpations: Abdomen is soft.     Tenderness: There is no abdominal tenderness.  Neurological:     Mental Status: He is alert.  UC Treatments / Results  Labs (all labs ordered are listed, but only abnormal results are displayed) Labs Reviewed - No data to display  EKG   Radiology No results found.  Procedures Procedures (including critical care time)  Medications Ordered in UC Medications - No data to display  Initial Impression / Assessment and Plan / UC Course  I have reviewed the triage vital signs and the nursing notes.  Pertinent labs & imaging results that were available during my care of the patient were reviewed by me and considered in my medical decision making (see chart for details).     1.  Viral gastroenteritis: Is currently resolved Work note given Return to urgent care if you have any questions or concerns. Final Clinical Impressions(s) / UC Diagnoses   Final diagnoses:  Viral gastroenteritis     Discharge Instructions      Maintain adequate hydration If symptoms worsen ,please return to urgent care to be re-evaluated     ED Prescriptions   None    PDMP not reviewed this encounter.   Merrilee Jansky, MD 01/19/21 [phone]
